# Patient Record
Sex: Male | Born: 1940 | Race: White | Hispanic: No | Marital: Married | Smoking: Former smoker
Health system: Southern US, Community
[De-identification: ages and names within clinical notes are randomized; demographics above are authoritative.]

## PROBLEM LIST (undated history)

## (undated) DIAGNOSIS — R519 Headache, unspecified: Secondary | ICD-10-CM

## (undated) DIAGNOSIS — K219 Gastro-esophageal reflux disease without esophagitis: Secondary | ICD-10-CM

## (undated) DIAGNOSIS — I10 Essential (primary) hypertension: Secondary | ICD-10-CM

## (undated) DIAGNOSIS — R51 Headache: Secondary | ICD-10-CM

## (undated) DIAGNOSIS — I209 Angina pectoris, unspecified: Secondary | ICD-10-CM

## (undated) DIAGNOSIS — M1990 Unspecified osteoarthritis, unspecified site: Secondary | ICD-10-CM

## (undated) DIAGNOSIS — C801 Malignant (primary) neoplasm, unspecified: Secondary | ICD-10-CM

---

## 2012-06-13 HISTORY — PX: OTHER SURGICAL HISTORY: SHX169

## 2014-05-19 ENCOUNTER — Inpatient Hospital Stay (HOSPITAL_COMMUNITY)
Admission: EM | Admit: 2014-05-19 | Discharge: 2014-05-21 | DRG: 247 | Disposition: A | Discharge status: Home / Self Care | Payer: Medicare Other | Source: Other Acute Inpatient Hospital | Attending: Cardiology | Admitting: Cardiology

## 2014-05-19 ENCOUNTER — Inpatient Hospital Stay (HOSPITAL_COMMUNITY): Admission: AD | Admit: 2014-05-19 | Payer: Self-pay | Source: Other Acute Inpatient Hospital | Admitting: Cardiology

## 2014-05-19 ENCOUNTER — Observation Stay: Payer: Self-pay | Admitting: Internal Medicine

## 2014-05-19 ENCOUNTER — Inpatient Hospital Stay: Admission: AD | Admit: 2014-05-19 | Payer: Self-pay | Source: Other Acute Inpatient Hospital | Admitting: Cardiology

## 2014-05-19 ENCOUNTER — Encounter (HOSPITAL_COMMUNITY)
Admission: EM | Disposition: A | Discharge status: Home / Self Care | Payer: Medicare Other | Source: Other Acute Inpatient Hospital | Attending: Cardiology

## 2014-05-19 ENCOUNTER — Encounter (HOSPITAL_COMMUNITY): Payer: Self-pay | Admitting: *Deleted

## 2014-05-19 DIAGNOSIS — I2510 Atherosclerotic heart disease of native coronary artery without angina pectoris: Secondary | ICD-10-CM | POA: Diagnosis not present

## 2014-05-19 DIAGNOSIS — I10 Essential (primary) hypertension: Secondary | ICD-10-CM | POA: Diagnosis present

## 2014-05-19 DIAGNOSIS — E118 Type 2 diabetes mellitus with unspecified complications: Secondary | ICD-10-CM | POA: Diagnosis present

## 2014-05-19 DIAGNOSIS — Z9221 Personal history of antineoplastic chemotherapy: Secondary | ICD-10-CM | POA: Diagnosis not present

## 2014-05-19 DIAGNOSIS — Z85118 Personal history of other malignant neoplasm of bronchus and lung: Secondary | ICD-10-CM

## 2014-05-19 DIAGNOSIS — R079 Chest pain, unspecified: Secondary | ICD-10-CM | POA: Diagnosis present

## 2014-05-19 DIAGNOSIS — I2119 ST elevation (STEMI) myocardial infarction involving other coronary artery of inferior wall: Principal | ICD-10-CM | POA: Diagnosis present

## 2014-05-19 DIAGNOSIS — H409 Unspecified glaucoma: Secondary | ICD-10-CM | POA: Diagnosis present

## 2014-05-19 DIAGNOSIS — Z87891 Personal history of nicotine dependence: Secondary | ICD-10-CM

## 2014-05-19 DIAGNOSIS — K219 Gastro-esophageal reflux disease without esophagitis: Secondary | ICD-10-CM | POA: Diagnosis present

## 2014-05-19 HISTORY — PX: CARDIAC CATHETERIZATION: SHX172

## 2014-05-19 HISTORY — DX: Headache: R51

## 2014-05-19 HISTORY — DX: Malignant (primary) neoplasm, unspecified: C80.1

## 2014-05-19 HISTORY — DX: Gastro-esophageal reflux disease without esophagitis: K21.9

## 2014-05-19 HISTORY — DX: Unspecified osteoarthritis, unspecified site: M19.90

## 2014-05-19 HISTORY — DX: Essential (primary) hypertension: I10

## 2014-05-19 HISTORY — DX: Angina pectoris, unspecified: I20.9

## 2014-05-19 HISTORY — DX: Headache, unspecified: R51.9

## 2014-05-19 LAB — COMPREHENSIVE METABOLIC PANEL
ALT: 11 U/L (ref 0–53)
AST: 30 U/L (ref 0–37)
Albumin: 3.5 g/dL (ref 3.4–5.0)
Albumin: 3.4 g/dL — ABNORMAL LOW (ref 3.5–5.2)
Alkaline Phosphatase: 85 U/L
Alkaline Phosphatase: 71 U/L (ref 39–117)
Anion Gap: 9 (ref 7–16)
Anion gap: 16 — ABNORMAL HIGH (ref 5–15)
BILIRUBIN TOTAL: 0.6 mg/dL (ref 0.2–1.0)
BUN: 26 mg/dL — ABNORMAL HIGH (ref 7–18)
BUN: 16 mg/dL (ref 6–23)
CALCIUM: 8.4 mg/dL — AB (ref 8.5–10.1)
CO2: 21 meq/L (ref 19–32)
CREATININE: 0.87 mg/dL (ref 0.50–1.35)
Calcium: 8.5 mg/dL (ref 8.4–10.5)
Chloride: 106 mmol/L (ref 98–107)
Chloride: 101 mEq/L (ref 96–112)
Co2: 25 mmol/L (ref 21–32)
Creatinine: 1.05 mg/dL (ref 0.60–1.30)
EGFR (African American): >60
EGFR (Non-African Amer.): >60
GFR, EST NON AFRICAN AMERICAN: 85 mL/min — AB (ref >90)
GLUCOSE: 128 mg/dL — AB (ref 65–99)
GLUCOSE: 130 mg/dL — AB (ref 70–99)
Osmolality: 286 (ref 275–301)
Potassium: 4 mmol/L (ref 3.5–5.1)
Potassium: 3.6 mEq/L — ABNORMAL LOW (ref 3.7–5.3)
SGOT(AST): 36 U/L (ref 15–37)
SGPT (ALT): 18 U/L
Sodium: 140 mmol/L (ref 136–145)
Sodium: 138 mEq/L (ref 137–147)
Total Bilirubin: 0.6 mg/dL (ref 0.3–1.2)
Total Protein: 7 g/dL (ref 6.4–8.2)
Total Protein: 6.4 g/dL (ref 6.0–8.3)

## 2014-05-19 LAB — CBC
HCT: 45.9 % (ref 40.0–52.0)
HGB: 15.4 g/dL (ref 13.0–18.0)
MCH: 32.4 pg (ref 26.0–34.0)
MCHC: 33.6 g/dL (ref 32.0–36.0)
MCV: 97 fL (ref 80–100)
Platelet: 173 10*3/uL (ref 150–440)
RBC: 4.75 10*6/uL (ref 4.40–5.90)
RDW: 12.4 % (ref 11.5–14.5)
WBC: 5.8 10*3/uL (ref 3.8–10.6)

## 2014-05-19 LAB — TROPONIN I
TROPONIN I: 2.12 ng/mL — AB (ref <0.30)
TROPONIN-I: 4.9 ng/mL — AB
Troponin-I: 0.45 ng/mL — ABNORMAL HIGH

## 2014-05-19 LAB — CK TOTAL AND CKMB (NOT AT ARMC)
CK, TOTAL: 94 U/L
CK-MB: 3 ng/mL (ref 0.5–3.6)

## 2014-05-19 LAB — APTT: ACTIVATED PTT: 29.3 s (ref 23.6–35.9)

## 2014-05-19 LAB — CK-MB: CK-MB: 7.3 ng/mL — ABNORMAL HIGH (ref 0.5–3.6)

## 2014-05-19 LAB — PROTIME-INR
INR: 0.9
Prothrombin Time: 12.5 secs (ref 11.5–14.7)

## 2014-05-19 LAB — D-DIMER(ARMC): D-Dimer: 706 ng/ml

## 2014-05-19 SURGERY — CORONARY STENT INTERVENTION
Laterality: Right

## 2014-05-19 MED ORDER — NITROGLYCERIN 1 MG/10 ML FOR IR/CATH LAB
INTRA_ARTERIAL | Status: AC
  Filled 2014-05-19: qty 10

## 2014-05-19 MED ORDER — ONDANSETRON HCL 4 MG/2ML IJ SOLN: 4.0000 mg | Freq: Four times a day (QID) | INTRAMUSCULAR | Status: DC | PRN

## 2014-05-19 MED ORDER — NITROGLYCERIN IN D5W 200-5 MCG/ML-% IV SOLN: 5.0000 ug/min | INTRAVENOUS | Status: DC

## 2014-05-19 MED ORDER — ASPIRIN 81 MG PO CHEW
81.0000 mg | CHEWABLE_TABLET | Freq: Every day | ORAL | Status: DC
  Administered 2014-05-20: 81 mg via ORAL
  Filled 2014-05-19 (×2): qty 1

## 2014-05-19 MED ORDER — SODIUM CHLORIDE 0.9 % IV SOLN
INTRAVENOUS | Status: DC
Start: 2014-05-19 — End: 2014-05-20
  Administered 2014-05-19: 04:00:00 via INTRAVENOUS

## 2014-05-19 MED ORDER — HEPARIN (PORCINE) IN NACL 2-0.9 UNIT/ML-% IJ SOLN
INTRAMUSCULAR | Status: AC
  Filled 2014-05-19: qty 1000

## 2014-05-19 MED ORDER — CEFAZOLIN SODIUM-DEXTROSE 2-3 GM-% IV SOLR
INTRAVENOUS | Status: AC
  Filled 2014-05-19: qty 50

## 2014-05-19 MED ORDER — ATORVASTATIN CALCIUM 80 MG PO TABS
80.0000 mg | ORAL_TABLET | Freq: Every day | ORAL | Status: DC
  Administered 2014-05-20: 80 mg via ORAL
  Filled 2014-05-19 (×3): qty 1

## 2014-05-19 MED ORDER — ASPIRIN EC 81 MG PO TBEC: 81.0000 mg | DELAYED_RELEASE_TABLET | Freq: Every day | ORAL | Status: DC

## 2014-05-19 MED ORDER — MIDAZOLAM HCL 2 MG/2ML IJ SOLN
INTRAMUSCULAR | Status: AC
  Filled 2014-05-19: qty 2

## 2014-05-19 MED ORDER — ATROPINE SULFATE 0.1 MG/ML IJ SOLN
INTRAMUSCULAR | Status: AC
  Filled 2014-05-19: qty 10

## 2014-05-19 MED ORDER — FENTANYL CITRATE 0.05 MG/ML IJ SOLN
INTRAMUSCULAR | Status: AC
  Filled 2014-05-19: qty 2

## 2014-05-19 MED ORDER — LIDOCAINE HCL (PF) 1 % IJ SOLN
INTRAMUSCULAR | Status: AC
  Filled 2014-05-19: qty 30

## 2014-05-19 MED ORDER — ASPIRIN 300 MG RE SUPP: 300.0000 mg | RECTAL | Status: DC

## 2014-05-19 MED ORDER — NITROGLYCERIN 0.4 MG SL SUBL: 0.4000 mg | SUBLINGUAL_TABLET | SUBLINGUAL | Status: DC | PRN

## 2014-05-19 MED ORDER — ASPIRIN 81 MG PO CHEW: 324.0000 mg | CHEWABLE_TABLET | ORAL | Status: DC

## 2014-05-19 MED ORDER — TICAGRELOR 90 MG PO TABS: 90.0000 mg | ORAL_TABLET | Freq: Two times a day (BID) | ORAL | Status: DC

## 2014-05-19 MED ORDER — ACETAMINOPHEN 325 MG PO TABS: 650.0000 mg | ORAL_TABLET | ORAL | Status: DC | PRN

## 2014-05-19 MED ORDER — BIVALIRUDIN 250 MG IV SOLR
INTRAVENOUS | Status: AC
  Filled 2014-05-19: qty 250

## 2014-05-19 MED ORDER — METOPROLOL TARTRATE 12.5 MG HALF TABLET
12.5000 mg | ORAL_TABLET | Freq: Two times a day (BID) | ORAL | Status: DC
  Administered 2014-05-20 (×2): 12.5 mg via ORAL
  Filled 2014-05-19 (×5): qty 1

## 2014-05-19 MED ORDER — TICAGRELOR 90 MG PO TABS
90.0000 mg | ORAL_TABLET | Freq: Two times a day (BID) | ORAL | Status: DC
  Administered 2014-05-20 (×2): 90 mg via ORAL
  Filled 2014-05-19 (×5): qty 1

## 2014-05-19 NOTE — CV Procedure (Signed)
PTCA stenting report dictated on 05/19/2014 dictation number is [id number]

## 2014-05-19 NOTE — H&P (Signed)
Brian Berg is an 73 y.o. male.   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 Radiation in the past, GERD, glaucoma, was transferred from [facility] cardiac cath lab for PCI to mid RCA. Patient while in the conference this morning developed retrosternal chest pain and elbow pain associated with nausea is lasted approximately 30-40 minutes her EKG doneat [facility] showed normal sinus rhythm with possible inferior wall myocardial infarction age undetermined with nonspecific T-wave changes in inferior leads and was noted to have elevated troponin I her first set 0.45 second set was 4.90. Patient substernally underwent left cardiac cath at [facility] which showed mild left and was noted to have 80-85% mid RCA stenosis. Patient is transferred to [facility] for emergency PCI to mid RCA.  No past medical history on file.  No past surgical history on file.  No family history on file. Social History:  has no tobacco, alcohol, and drug history on file.  Allergies: Allergies not on file  No prescriptions prior to admission    No results found for this or any previous visit (from the past 48 hour(s)). No results found.  Review of Systems  Constitutional: Negative for fever and chills.  Eyes: Negative for double vision and photophobia.  Respiratory: Negative for cough, hemoptysis, sputum production and shortness of breath.   Cardiovascular: Positive for chest pain. Negative for palpitations, orthopnea and claudication.  Gastrointestinal: Positive for nausea. Negative for vomiting and abdominal pain.  Genitourinary: Negative for dysuria.  Neurological: Negative for dizziness and headaches.    There were no vitals taken for this visit. Physical Exam  Constitutional: He is oriented to person, place, and time.   HENT:  Head: Normocephalic and atraumatic.  Eyes: Conjunctivae are normal. Pupils are equal, round, and reactive to light. Left eye exhibits no discharge. No scleral icterus.  Neck: Normal range of motion. Neck supple. No JVD present. No tracheal deviation present. No thyromegaly present.  Cardiovascular: Normal rate, regular rhythm and intact distal pulses.   Murmur (soft systolic murmur and S4 gallop noted) heard. Respiratory: Effort normal and breath sounds normal. No respiratory distress. He has no wheezes. He has no rales.  GI: Soft. Bowel sounds are normal. He exhibits no distension. There is no tenderness. There is no rebound.  Musculoskeletal: He exhibits no edema or tenderness.  Neurological: He is alert and oriented to person, place, and time.     Assessment/Plan Status post inferior wall myocardial infarction Hypertension History of CVA of lung GERD Elevated glucose rule out diabetes mellitus History of glaucoma History of remote tobacco abuse Plan Discussed with patient briefly regarding PTCA stenting this risk and benefits and consents for PCI  [facility] 05/19/2014, 7:31 PM

## 2014-05-19 NOTE — Interval H&P Note (Signed)
He he takes aspciCath Lab Visit (complete for each Cath Lab visit)  Clinical Evaluation Leading to the Procedure:   ACS: Yes.    Non-ACS:    Anginal Classification: CCS IV  Anti-ischemic medical therapy: Maximal Therapy (2 or more classes of medications)  Non-Invasive Test Results: No non-invasive testing performed  Prior CABG: No previous CABG      I was present and participated in the regional anesthetic procedure key events.  By signing, I attest that I have identified and re-evaluated the patient immediately before the induction of anesthesia and I am satisfied that my anesthetic plan is suitable for the patient's condition and procedure.History and Physical Interval Note:  05/19/2014 7:38 PM  Brian Berg  has presented today for surgery, with the diagnosis of NSTEMI  The various methods of treatment have been discussed with the patient and family. After consideration of risks, benefits and other options for treatment, the patient has consented to  Procedure(s): LEFT HEART CATHETERIZATION WITH CORONARY ANGIOGRAM (N/A) as a surgical intervention .  The patient's history has been reviewed, patient examined, no change in status, stable for surgery.  I have reviewed the patient's chart and labs.  Questions were answered to the patient's satisfaction.     Clent Demark

## 2014-05-20 LAB — LIPID PANEL
CHOL/HDL RATIO: 3.5 ratio
Cholesterol: 176 mg/dL (ref 0–200)
HDL: 50 mg/dL (ref >39)
LDL Cholesterol: 102 mg/dL — ABNORMAL HIGH (ref 0–99)
Triglycerides: 120 mg/dL (ref <150)
VLDL: 24 mg/dL (ref 0–40)

## 2014-05-20 LAB — CBC
HCT: 39.7 % (ref 39.0–52.0)
HEMOGLOBIN: 13.9 g/dL (ref 13.0–17.0)
MCH: 32.5 pg (ref 26.0–34.0)
MCHC: 35 g/dL (ref 30.0–36.0)
MCV: 92.8 fL (ref 78.0–100.0)
PLATELETS: 158 10*3/uL (ref 150–400)
RBC: 4.28 MIL/uL (ref 4.22–5.81)
RDW: 12.3 % (ref 11.5–15.5)
WBC: 6.6 10*3/uL (ref 4.0–10.5)

## 2014-05-20 LAB — TROPONIN I
Troponin I: 0.82 ng/mL (ref <0.30)
Troponin I: 1.28 ng/mL (ref <0.30)

## 2014-05-20 LAB — BASIC METABOLIC PANEL
Anion gap: 14 (ref 5–15)
BUN: 14 mg/dL (ref 6–23)
CO2: 20 mEq/L (ref 19–32)
Calcium: 8.5 mg/dL (ref 8.4–10.5)
Chloride: 102 mEq/L (ref 96–112)
Creatinine, Ser: 0.88 mg/dL (ref 0.50–1.35)
GFR calc Af Amer: >90 mL/min (ref >90)
GFR, EST NON AFRICAN AMERICAN: 84 mL/min — AB (ref >90)
Glucose, Bld: 104 mg/dL — ABNORMAL HIGH (ref 70–99)
POTASSIUM: 3.8 meq/L (ref 3.7–5.3)
Sodium: 136 mEq/L — ABNORMAL LOW (ref 137–147)

## 2014-05-20 LAB — MRSA PCR SCREENING: MRSA BY PCR: NEGATIVE

## 2014-05-20 MED ORDER — TIMOLOL MALEATE 0.5 % OP SOLN
1.0000 [drp] | Freq: Every day | OPHTHALMIC | Status: DC
  Administered 2014-05-20: 1 [drp] via OPHTHALMIC
  Filled 2014-05-20: qty 5

## 2014-05-20 MED ORDER — PANTOPRAZOLE SODIUM 40 MG PO TBEC
40.0000 mg | DELAYED_RELEASE_TABLET | Freq: Every day | ORAL | Status: DC
Start: 2014-05-20 — End: 2014-05-21
  Administered 2014-05-20: 40 mg via ORAL
  Filled 2014-05-20: qty 1

## 2014-05-20 MED ORDER — DOXAZOSIN MESYLATE 4 MG PO TABS
4.0000 mg | ORAL_TABLET | Freq: Every day | ORAL | Status: DC
  Administered 2014-05-20: 4 mg via ORAL
  Filled 2014-05-20 (×2): qty 1

## 2014-05-20 NOTE — Cardiovascular Report (Signed)
NAME:  Brian Berg, Brian Berg                ACCOUNT NO.:  [id number]  MEDICAL RECORD NO.:  [id number]  LOCATION:  [facility]                         FACILITY:  [facility]  PHYSICIAN:  Shavonda Wiedman N. Terrence Dupont, M.D. DATE OF BIRTH:  07-09-1941  DATE OF PROCEDURE:  05/19/2014 DATE OF DISCHARGE:                           CARDIAC CATHETERIZATION   PROCEDURES PERFORMED: 1. Successful percutaneous transluminal coronary angioplasty to mid     right coronary artery using 2.5 x 12 mm long Emerge balloon. 2. Successful deployment of 3.0 x 23 mm long Xience Alpine drug-     eluting stent in mid right coronary artery. 3. Successful postdilatation of this stent using 3.25 x 15 mm long [place]     Emerge balloon.  INDICATION FOR THE PROCEDURE:  Mr. Kandler is a 73 year old male with past medical history significant for hypertension, history of CA of the lung status post chemotherapy and radiation in the past, GERD, glaucoma, remote tobacco abuse who was transferred from [facility] Cath Lab for PCI to mid RCA.  The patient while in the conference this morning developed retrosternal chest pain, and elbow pain associated with nausea, which lasted approximately 30-40 minutes.  EKG done at [facility] showed normal sinus rhythm with possible inferior wall myocardial infarction with Q-waves in lead 3, aVF, and nonspecific T-wave changes in the inferior leads, and was noted to have elevated troponin I of 0.25, second set was 4.90.  Subsequently, the patient underwent left cardiac cath at [facility], which showed mild LAD and circumflex disease, and was noted to have 80-85% mid RCA stenosis with ruptured plaque with TIMI-3 distal flow.  The patient was transferred to [facility] for emergency PCI to RCA.  After obtaining the informed consent, the patient was brought to the cath lab and was placed on fluoroscopy table.  Right groin was prepped and draped in usual fashion.  A 1%  Xylocaine was used for local anesthesia in the right groin.  The patient already had 5-French arterial sheath, which was exchanged over the wire to 6-French sheath without difficulty. Next, 6-French right Judkins guiding catheter was advanced over the wire under fluoroscopic guidance up to the ascending aorta.  Wire was pulled out.  The catheter was aspirated and connected to the Manifold. Catheter was further advanced and engaged into right coronary ostium. Multiple views of the right system were obtained.  FINDINGS:  RCA has 20-25% proximal stenosis and then 80-90% mid stenosis with ruptured plaque with TIMI-3 distal flow.  PLV branch has 40% proximal stenosis with haziness, PDA was patent.  INTERVENTIONAL PROCEDURE:  Successful PTCA to mid RCA was done using 2.5 x 12 mm long Emerge balloon for predilatation, and then 3.0 x 23 mm long Xience Alpine drug-eluting stent was deployed in mid RCA at 11 atmospheric pressure.  The stent was post dilated using 3.25 x 15 mm long [place] Emerge balloon going up to 18 atmospheric pressure.  Lesion dilated from 80-90% to 0% residual with excellent TIMI grade 3 distal flow without evidence of dissection or distal embolization.  The patient received weight based Angiomax during the procedure. The patient already received aspirin and Brilinta at [facility]  prior to transfer to [facility].  The patient tolerated the procedure well.  There were no complications.  The patient was transferred to CCU in stable condition.     Allegra Lai. Terrence Dupont, M.D.     MNH/MEDQ  D:  05/19/2014  T:  05/20/2014  Job:  [id number]

## 2014-05-20 NOTE — Progress Notes (Signed)
CARDIAC REHAB PHASE I   PRE:  Rate/Rhythm: 77 sinus  BP:  Supine:   Sitting: 135/101  Standing:    SaO2: 95 RA  MODE:  Ambulation: 700 ft   POST:  Rate/Rhythem: 91 sinus  BP:  Supine:   Sitting: 141/82  Standing:    SaO2: 95 RA  Pt ambulated 700 ft with assist x1.  Pt tolerated walk well without complaint.  Pt returned to bed after walk and voiding for education.  Discussed MI book, stent book and card, Brillinta, risk factors, restrictions, when to call 911/NTG use, diet, and exercise.  Also permission given to send info to [facility] for Cardiac Rehab Phase II.  Pt encouraged to establish with a cardiologist closer to home for f/u.  Pt and wife voiced understanding. Alberteen Sam, MA, ACSM RCEP [phone]  Clotilde Dieter

## 2014-05-20 NOTE — Progress Notes (Signed)
UR completed 

## 2014-05-20 NOTE — Progress Notes (Signed)
CRITICAL VALUE ALERT  Critical value received: Trop 2.12  Date of notification:  05/19/2014  Time of notification:  2150  Critical value read back:Yes.    Nurse who received alert:  Tyrell Antonio  MD notified (1st page):  Marshall Cork, MD  Time of first page:  2155  MD notified (2nd page):  Time of second page:  Responding MD:  Bailey Mech  Time MD responded:  2200

## 2014-05-20 NOTE — Progress Notes (Signed)
Patient arrived via stretcher from cath lab. 104F sheath in Right groin, level zero. VSS see flowsheet. Patient alert and cooperative. Very pleasant family at bedside with patient. 0.9 NS infusing via pump into PIV in right FA. Dr. Marshall Cork in room to check on patient. Patient voiding using urinal. Will continue to monitor closely.

## 2014-05-20 NOTE — Progress Notes (Signed)
Ref: No PCP Per Patient   Subjective:  No chest pain. Cardiac enzymes almost trending down. Afebrile.  Objective:  Vital Signs in the last 24 hours: Temp:  [97.4 F (36.3 C)-98.1 F (36.7 C)] 98.1 F (36.7 C) (11/07 0755) Pulse Rate:  [66-76] 67 (11/07 0755) Cardiac Rhythm:  [-] Normal sinus rhythm;Heart block (11/07 0030) Resp:  [9-20] 16 (11/07 0755) BP: (99-167)/(68-106) 136/74 mmHg (11/07 0755) SpO2:  [92 %-100 %] 95 % (11/07 0755) Arterial Line BP: (139-166)/(72-87) 139/72 mmHg (11/06 2330) Weight:  [75.4 kg (166 lb 3.6 oz)] 75.4 kg (166 lb 3.6 oz) (11/06 2131)  Physical Exam: BP Readings from Last 1 Encounters:  05/20/14 136/74    Wt Readings from Last 1 Encounters:  05/19/14 75.4 kg (166 lb 3.6 oz)    Weight change:   HEENT: [place]/AT, Eyes- PERL, EOMI, Conjunctiva-Pink, Sclera-Non-icteric Neck: No JVD, No bruit, Trachea midline. Lungs:  Clear, Bilateral. Cardiac:  Regular rhythm, normal S1 and S2, no S3.  Abdomen:  Soft, non-tender. Extremities:  No edema present. No cyanosis. No clubbing. Very small bruise right groin. CNS: AxOx3, Cranial nerves grossly intact, moves all 4 extremities. Right handed. Skin: Warm and dry.   Intake/Output from previous day: 11/06 0701 - 11/07 0700 In: -  Out: 825 [Urine:825]    Lab Results: BMET    Component Value Date/Time   NA 136* 05/20/2014 0330   NA 138 05/19/2014 2150   K 3.8 05/20/2014 0330   K 3.6* 05/19/2014 2150   CL 102 05/20/2014 0330   CL 101 05/19/2014 2150   CO2 20 05/20/2014 0330   CO2 21 05/19/2014 2150   GLUCOSE 104* 05/20/2014 0330   GLUCOSE 130* 05/19/2014 2150   BUN 14 05/20/2014 0330   BUN 16 05/19/2014 2150   CREATININE 0.88 05/20/2014 0330   CREATININE 0.87 05/19/2014 2150   CALCIUM 8.5 05/20/2014 0330   CALCIUM 8.5 05/19/2014 2150   GFRNONAA 84* 05/20/2014 0330   GFRNONAA 85* 05/19/2014 2150   GFRAA >90 05/20/2014 0330   GFRAA >90 05/19/2014 2150   CBC    Component Value Date/Time   WBC  6.6 05/20/2014 0330   RBC 4.28 05/20/2014 0330   HGB 13.9 05/20/2014 0330   HCT 39.7 05/20/2014 0330   PLT 158 05/20/2014 0330   MCV 92.8 05/20/2014 0330   MCH 32.5 05/20/2014 0330   MCHC 35.0 05/20/2014 0330   RDW 12.3 05/20/2014 0330   HEPATIC Function Panel  Recent Labs  05/19/14 2150  PROT 6.4   HEMOGLOBIN A1C No components found for: HGA1C,  MPG CARDIAC ENZYMES Lab Results  Component Value Date   TROPONINI 1.28* 05/20/2014   TROPONINI 0.82* 05/20/2014   TROPONINI 2.12* 05/19/2014   BNP No results for input(s): PROBNP in the last 8760 hours. TSH No results for input(s): TSH in the last 8760 hours. CHOLESTEROL  Recent Labs  05/20/14 0330  CHOL 176    Scheduled Meds: . aspirin  81 mg Oral Daily  . atorvastatin  80 mg Oral q1800  . doxazosin  4 mg Oral QHS  . metoprolol tartrate  12.5 mg Oral BID  . pantoprazole  40 mg Oral Q1200  . ticagrelor  90 mg Oral BID  . timolol  1 drop Both Eyes Daily   Continuous Infusions: . sodium chloride 150 mL/hr at 05/19/14 0359  . nitroGLYCERIN     PRN Meds:.acetaminophen, nitroGLYCERIN, ondansetron (ZOFRAN) IV  Assessment/Plan: Acute inferior wall myocardial infarction S/P Stent placement in RCA Hypertension History  of CA of lung GERD Elevated glucose rule out diabetes mellitus History of glaucoma History of remote tobacco abuse  Resume home medications. Increase activity. Transfer to telemetry bed.   LOS: 1 day    Dixie Dials  MD  05/20/2014, 11:10 AM

## 2014-05-20 NOTE — Progress Notes (Signed)
Sheath pulled from Right groin, manual pressure held for 40 minutes. VSS during procedure. Patient given directions for lying flat and still for 4 hours post procedure. Patient stated he understood. Wife at bedside and she stated she would watch him. Right groin level zero. Patient denies chest pain or pressure. Monitoring closely.

## 2014-05-20 NOTE — Progress Notes (Signed)
Critical lab value called at 2150 for Trop 2.12. Dr. Marshall Cork notified that the first Trop after procedure will be elevated while he was in patients room. Dr. Marshall Cork stated he was aware of that but wanted one drawn now and then again in 6 hours. 1st one drawn right after patient arrived in room from cath lab. Patient had DIS placed in RCA this evening by Dr. Marshall Cork.  Patient denies any chest pain or pressure. Will continue to monitor closely.

## 2014-05-21 MED ORDER — TICAGRELOR 90 MG PO TABS: 90.0000 mg | ORAL_TABLET | Freq: Two times a day (BID) | ORAL | Status: AC

## 2014-05-21 MED ORDER — METOPROLOL TARTRATE 25 MG PO TABS: 25.0000 mg | ORAL_TABLET | Freq: Two times a day (BID) | ORAL | Status: AC

## 2014-05-21 MED ORDER — NITROGLYCERIN 0.4 MG SL SUBL: 0.4000 mg | SUBLINGUAL_TABLET | SUBLINGUAL | Status: AC | PRN

## 2014-05-21 MED ORDER — ATORVASTATIN CALCIUM 80 MG PO TABS: 80.0000 mg | ORAL_TABLET | Freq: Every day | ORAL | Status: AC

## 2014-05-21 MED ORDER — ASPIRIN 81 MG PO CHEW: 81.0000 mg | CHEWABLE_TABLET | Freq: Every day | ORAL | Status: AC

## 2014-05-21 NOTE — Plan of Care (Signed)
Problem: Phase I Progression Outcomes Goal: Distal pulses equal to baseline Outcome: Progressing

## 2014-05-21 NOTE — Plan of Care (Signed)
Problem: Phase I Progression Outcomes Goal: Pain controlled with appropriate interventions Outcome: Completed/Met Date Met:  05/21/14     

## 2014-05-21 NOTE — Discharge Summary (Signed)
Physician Discharge Summary  Patient ID: Brian Berg MRN: [id number] DOB/AGE: 72-May-1942 72 y.o.  Admit date: 05/19/2014 Discharge date: 05/21/2014  Admission Diagnoses: Acute inferior wall myocardial infarction Hypertension History of CA of lung GERD Elevated glucose rule out diabetes mellitus History of glaucoma History of remote tobacco abuse  Discharge Diagnoses:  Principle Problem: *  Acute NSTEMI * S/P Stent placement in RCA Hypertension History of CA of lung GERD Diabetes Mellitus, II, diet controlled History of glaucoma History of remote tobacco abuse  Discharged Condition: good  Hospital Course: 73 year old male with past medical history significant for hypertension, CA of right lung status post chemotherapy and radiation in the past, GERD, glaucoma, was transferred from [facility] hospital's cardiac cath lab for PCI to mid RCA. Patient while in the conference this morning developed retrosternal chest pain and elbow pain associated with nausea that lasted approximately 30-40 minutes. His EKG done at [facility] showed normal sinus rhythm with possible inferior wall myocardial infarction age undetermined with nonspecific T-wave changes in inferior leads and was noted to have elevated troponin I. Patient substernally underwent left cardiac cath at [facility] which showed mild left and was noted to have 80-85% mid RCA stenosis. Patient is transferred to [facility] for emergency PCI to mid RCA. He had 3.0x 23 mm Xience Alpine drug eluting stent deployed successfully by Dr. Terrence Dupont. His elevated blood sugars are controlled with diet for now. Discussed diet, activity and holding celecoxib for 1 month. Patient prefers cardiac rehab in [place].   Consults: cardiology  Significant Diagnostic Studies: labs: Normal CBC, BMET with mildly elevated blood sugars. Elevated troponin-I -0.82 to 4.90. Normal total cholesterol with LDL cholesterol of 102  mg. and HDL cholesterol level of 50 mg.  EKG-SR with 1st degree AV block.  Cardiac cath-Successful placement of 3.0 x 23 mm Xience Alpine DES in RCA.  Treatments: cardiac meds: Aspirin, Atorvastatin, Ticagrelor, metoprolol and home medications except celecoxib.  Discharge Exam: Blood pressure 112/81, pulse 80, temperature 98.7 F (37.1 C), temperature source Oral, resp. rate 19, height 5\' 7"  (1.702 m), weight 75.4 kg (166 lb 3.6 oz), SpO2 94 %. HEENT: [place]/AT, Eyes- PERL, EOMI, Conjunctiva-Pink, Sclera-Non-icteric Neck: No JVD, No bruit, Trachea midline. Lungs: Clear, Bilateral. Cardiac: Regular rhythm, normal S1 and S2, no S3.  Abdomen: Soft, non-tender. Extremities: No edema present. No cyanosis. No clubbing. Very small bruise right groin. CNS: AxOx3, Cranial nerves grossly intact, moves all 4 extremities. Right handed. Skin: Warm and dry.  Disposition: 01, Self care or Home.     Medication List    STOP taking these medications        celecoxib 200 MG capsule  Commonly known as:  CELEBREX     FLUZONE HIGH-DOSE 0.5 ML Susy  Generic drug:  Influenza Vac Split High-Dose     PREVNAR 13 Susp injection  Generic drug:  pneumococcal 13-valent conjugate vaccine      TAKE these medications        aspirin 81 MG chewable tablet  Chew 1 tablet (81 mg total) by mouth daily.     atorvastatin 80 MG tablet  Commonly known as:  LIPITOR  Take 1 tablet (80 mg total) by mouth daily at 6 PM.     BIOFLEX Tabs  Take 1 tablet by mouth 2 (two) times daily.     diphenhydramine-acetaminophen 25-500 MG Tabs  Commonly known as:  TYLENOL PM  Take 2 tablets by mouth at bedtime.     doxazosin 4 MG  tablet  Commonly known as:  CARDURA  Take 4 mg by mouth at bedtime.     metoprolol tartrate 25 MG tablet  Commonly known as:  LOPRESSOR  Take 1 tablet (25 mg total) by mouth 2 (two) times daily.     multivitamin with minerals Tabs tablet  Take 1 tablet by mouth daily.     nitroGLYCERIN  0.4 MG SL tablet  Commonly known as:  NITROSTAT  Place 1 tablet (0.4 mg total) under the tongue every 5 (five) minutes x 3 doses as needed for chest pain.     omeprazole 40 MG capsule  Commonly known as:  PRILOSEC  Take 40 mg by mouth at bedtime.     SUMAtriptan 100 MG tablet  Commonly known as:  IMITREX  Take 100 mg by mouth daily as needed for migraine.     ticagrelor 90 MG Tabs tablet  Commonly known as:  BRILINTA  Take 1 tablet (90 mg total) by mouth 2 (two) times daily.     timolol 0.5 % ophthalmic solution  Commonly known as:  TIMOPTIC  Place 1 drop into both eyes daily.           Follow-up Information    Follow up with [facility] A, MD. Schedule an appointment as soon as possible for a visit in 1 week.   Specialty:  Internal Medicine   Contact information:   [address] [place] [address] [phone]       Signed: Birdie Riddle 05/21/2014, 10:55 AM

## 2014-05-22 LAB — POCT ACTIVATED CLOTTING TIME: Activated Clotting Time: 382 seconds

## 2014-05-22 LAB — HEMOGLOBIN A1C
Hgb A1c MFr Bld: 5.7 % — ABNORMAL HIGH (ref <5.7)
Mean Plasma Glucose: 117 mg/dL — ABNORMAL HIGH (ref <117)

## 2014-05-22 MED FILL — Sodium Chloride IV Soln 0.9%: INTRAVENOUS | Qty: 50 | Status: AC

## 2014-06-22 ENCOUNTER — Encounter (HOSPITAL_COMMUNITY): Payer: Self-pay | Admitting: Cardiology

## 2014-11-04 NOTE — Discharge Summary (Signed)
PATIENT NAME:  Brian Berg, Brian Berg MR#:  [id number] DATE OF BIRTH:  [birth year]  DATE OF ADMISSION:  05/19/2014 DATE OF DISCHARGE:    PRIMARY CARE PHYSICIAN:  Delle Reining, MD, in [place], [place].   FINAL DIAGNOSES: 1.  Acute myocardial infarction.  2.  Gastroesophageal reflux disease.  3.  Hypertension.  4.  Glaucoma.  5.  Lung cancer history, also spots on the liver.  6.  Thyroid mass on CAT scan.   CURRENT MEDICATIONS: Include normal saline at 100 mL an hour, heparin drip, Tylenol p.r.n., Lipitor 40 mg at bedtime, Cardura 4 mg at bedtime, fentanyl injection 25 mg every 1 to 2 minutes after cardiac catheter, NovoLog sliding scale, metoprolol 12.5 mg b.i.d., multivitamin 1 tablet daily, Protonix 40 mg daily, aspirin 81 mg daily.   HOSPITAL COURSE: The patient was admitted and transferred to [facility] on 05/19/2014. The patient had a cardiac catheter done on 11/06, that showed mid RCA 90% stenosis; troponin went up to 4.9. CT scan of the chest showed a left right-sided fibroid mass. Liver hypodensity nonspecific, abnormal heterogeneous density right apex extending from the right hilum with volume loss may be related to prior granulomatous disease, however, lung malignancy is not excluded; in talking with the patient, that is where his lung cancer was; his first troponin was 0.45; white blood cell count 5.8, H and H 15.4 and 45.9, platelet count 173,000, glucose 128, BUN 26, creatinine 1.05, sodium 140, potassium 4.0, chloride 106, CO2 of 25, calcium 8.4; liver function tests normal range; d-dimer was elevated at 706.   The patient will be transferred to [facility], Dr. Humphrey Rolls spoke with Dr. Terrence Dupont there. The patient will go for intervention on the lesion. Medical management as per ordered. For the thyroid mass on CAT scan, I was going to order an ultrasound in the morning and follow up with a TSH and FT4; for his lung cancer history I asked him to follow up with CAT scan report here and bring  it to his oncologist for comparison. For his gastroesophageal reflux I have him on Protonix; for his hypertension, I added metoprolol; for his acute MI he is on heparin drip, aspirin, statin and metoprolol.   TIME SPENT ON DISCHARGE: Was 35 minutes.    ____________________________ Tana Conch. Leslye Peer, MD rjw:nt D: 05/19/2014 17:47:40 ET T: 05/19/2014 17:53:46 ET JOB#: [id number]  cc: Tana Conch. Leslye Peer, MD, <Dictator> Marisue Brooklyn MD ELECTRONICALLY SIGNED 05/25/2014 1:35

## 2014-11-04 NOTE — H&P (Signed)
PATIENT NAME:  Brian Berg, Brian Berg MR#:  [id number] DATE OF BIRTH:  Jan 06, 1941  DATE OF ADMISSION:  05/19/2014  PRIMARY CARE PHYSICIAN: Dr. Bo Merino [place], [place])  CHIEF COMPLAINT: Chest pain.   HISTORY OF PRESENT ILLNESS: A 74 year old man with history of lung cancer presents to the ER with chest pain in the center of his chest, sharp in nature, pain down in both of his elbows, pain rated at a scale of 8/10 in intensity. He does have arthritis so he ended up wanting to sit down for a little while. It got a little bit better. He did have pains over the past month that happened but got better after belching. He had some nausea but no shortness of breath. In the ER, he was found to have a borderline troponin at 0.45, a CAT scan of the chest that was negative for PE. Hospitalist services were contacted for further evaluation.   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   PAST SURGICAL HISTORY: Lung surgery, right shoulder surgery, hernia surgery, and cataract surgery.   ALLERGIES: MORPHINE, OXYCONTIN.  MEDICATIONS: Include aspirin 81 mg daily, Celebrex 200 mg twice a day, doxazosin 4 mg daily, multivitamin daily, omeprazole 40 mg daily, sumatriptan as needed for headache. He also states that he takes an eyedrop.   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 Quit drinking many years ago. No drug use. Works as a Mining engineer.   FAMILY HISTORY: Father died at 57 of multiple myeloma. Mother died of colon cancer.   REVIEW OF SYSTEMS:   CONSTITUTIONAL: Positive for fatigue. No fever, chills, or sweats.  EYES: Does wear glasses.  EARS, NOSE, MOUTH AND THROAT: Decreased hearing. No sore throat. No difficulty swallowing.  CARDIOVASCULAR: Positive for chest pain. No palpitations.   RESPIRATORY: No shortness of breath. No cough. No sputum. No hemoptysis.  GASTROINTESTINAL: No nausea. No vomiting. No abdominal pain. No diarrhea. No constipation. No bright red blood per rectum. No melena.  GENITOURINARY: No burning on urination or hematuria.  MUSCULOSKELETAL: Positive for joint pain.  INTEGUMENT: No rashes or eruptions.  NEUROLOGIC: No fainting or blackouts.  PSYCHIATRIC: No anxiety or depression.  ENDOCRINE: No thyroid problems.  HEMATOLOGIC AND LYMPHATIC: No anemia.   PHYSICAL EXAMINATION: VITAL SIGNS: Temperature 98.3, pulse 84, respirations 18, blood pressure 151/107, pulse ox 96% on room air.  GENERAL: No respiratory distress.  EYES: Conjunctivae and lids normal. Right pupil irregular secondary to cataract surgery. Left pupil pinpoint. Extraocular muscles intact.  EARS, NOSE, MOUTH AND THROAT: Tympanic membranes no erythema. Nasal mucosa no erythema. Throat no erythema, no exudate seen. Lips and gums no lesions.  NECK: No JVD. No bruits. No lymphadenopathy. No thyromegaly. No thyroid nodules palpated.  LUNGS: Clear to auscultation. No use of accessory muscles to breathe. No rhonchi, rales, or wheeze heard.  HEART: S1, S2 normal. No gallops, rubs, or murmurs heard. Carotid upstroke 2+ bilaterally. No bruits. Dorsalis pedis pulses 2+ bilaterally. No edema of the lower extremity.  ABDOMEN: Soft, nontender. No organosplenomegaly. Normoactive bowel sounds. No masses felt.  LYMPHATIC: No lymph nodes in the neck.  MUSCULOSKELETAL: No clubbing, edema, or cyanosis.  SKIN: No rashes or ulcers seen.  NEUROLOGIC: Cranial nerves II through XII grossly intact. Deep tendon reflexes 2+ bilateral lower extremities.  PSYCHIATRIC: The patient is oriented to person, place, and time.  DIAGNOSTIC DATA: D-dimer 706. Glucose 128, BUN 26, creatinine 1.05, sodium 140, potassium 4, chloride 106, CO2 25, calcium 8.4. Liver function tests normal range. White blood cell count 5.8, H and H 15.4  and 45.7, platelet count 173,000. Troponin borderline at 0.45.   Chest x-ray shows distorted partially opacified right lung status post right partial pneumonectomy and radiation.   CT angiogram of chest for PE: Abnormal heterogeneous density right apex extending into the right hilum associated with volume loss. Findings may be related to granulomatous disease, however, lung malignancy is not excluded. Mediastinal adenopathy. Left-sided thyroid mass. Ultrasound recommended. Liver hypodensity. Nonspecific metastatic disease not excluded. MRI recommended.   EKG: Normal sinus rhythm, 82 beats per minute, and looks like Q waves inferiorly.   ASSESSMENT AND PLAN: 1.  Chest pain with elevated troponin concerning for myocardial infarction. I spoke with Dr. Humphrey Rolls, cardiology, who will come and evaluate for potential cardiac catheterization. I added on a troponin for the 1:00 p.m. lab because they just drew his CPK for some reason. We will give aspirin stat, metoprolol stat. We will load with IV heparin bolus. Dr. Humphrey Rolls will evaluate for potential cardiac catheterization. I will order a lipid profile for tomorrow morning and start high-dose Lipitor.  2.  Gastroesophageal reflux disease, on omeprazole as outpatient. Continue Protonix here.  3.  Hypertension. Blood pressure slightly elevated. Add metoprolol.  4.  Glaucoma. Try to figure out which eye drop he is on and then order it.  5.  Lung cancer history, also history of spots on the liver, as per family. I recommended following up with oncologist and bringing their CAT scan here for comparison to previous CAT scans to make sure he does not have metastatic disease.  6.  Thyroid mass on CAT scan. Will get an ultrasound tomorrow morning for further evaluation and check a TSH and free T4.  CODE STATUS: The patient is a FULL code.   TIME SPENT ON ADMISSION: 55 minutes.  ____________________________ Tana Conch. Leslye Peer, MD rjw:sb D: 05/19/2014 14:29:17  ET T: 05/19/2014 14:45:32 ET JOB#: [id number]  cc: Tana Conch. Leslye Peer, MD, <Dictator> Bo Merino, MD [place], [place]) Marisue Brooklyn MD ELECTRONICALLY SIGNED 05/25/2014 1:35

## 2014-11-04 NOTE — Consult Note (Signed)
PATIENT NAME:  Brian Berg, LAMPHIER MR#:  [id number] DATE OF BIRTH:  [birth year]  DATE OF CONSULTATION:  05/19/2014  REFERRING PHYSICIAN:   CONSULTING PHYSICIAN:  Dionisio David, MD  INDICATION FOR CONSULTATION: Chest pain and elevated troponin.   HISTORY OF PRESENT ILLNESS: This is a 74 year old white male with a past medical history of lung cancer status post resection and chemotherapy, apparently been cured, presented to the Emergency Room with chest pain. Chest pain was described as left precordial, pressure-type and sharp chest pain.  He is no longer having any chest pain at this time.   PAST MEDICAL HISTORY: History of hypertension, hyperlipidemia, no history of diabetes, history of lung cancer that was resected and had chemotherapy, apparently is working and doing well.   PHYSICAL EXAMINATION:  GENERAL: He is alert, oriented x3, in no acute distress.  VITAL SIGNS: Stable.  HEENT:  No JVD.  LUNGS: Clear.  HEART: Regular rate and rhythm. Normal S1, S2. No audible murmur.  ABDOMEN: Soft, nontender, positive bowel sounds.  EXTREMITIES: No pedal edema.  NEUROLOGIC: The patient appears to be intact.   LABORATORY DATA:  EKG shows normal sinus rhythm, 82 beats per minute, questionable old inferior wall MI. His troponin is 0.45, CPK 94, MB fraction 3. BUN is 26, creatinine 1.05, glucose 128. His CBC, white count is 5.8, H and H is 15.4/45.9.   ASSESSMENT AND PLAN: The patient has probably non-ST elevation myocardial infarction going on with elevated troponin and chest pain. Advise cardiac catheterization, give heparin in the meantime and aspirin. Also advised getting an echocardiogram.   Thank you very much for the referral. We will schedule the patient for cardiac catheterization today.    ____________________________ Dionisio David, MD sak:bu D: 05/19/2014 14:47:59 ET T: 05/19/2014 15:11:31 ET JOB#: [id number]  cc: Dionisio David, MD, <Dictator> Dionisio David MD ELECTRONICALLY SIGNED  06/15/2014 15:36

## 2015-07-25 IMAGING — CT CT ANGIO CHEST
2 of 6 series · 18 of 36 positions shown · IV contrast (APPLIED)
Comparison: None.

CLINICAL DATA: Chest pain wall eating breakfast. Bilateral arm
numbness and achiness. History of lung cancer. Initial encounter.

EXAM:
CT ANGIOGRAPHY CHEST WITH CONTRAST
TECHNIQUE: Multidetector CT imaging of the chest was performed using the
standard protocol during bolus administration of intravenous
contrast. Multiplanar CT image reconstructions and MIPs were
obtained to evaluate the vascular anatomy.
CONTRAST:  100 cc Isovue 370

[Series 5: pe 1.0 thins · axial · 0.70mm/px · z∈[-830,-591]mm · 17 of 269 slices shown]
[im 15/269  lung]
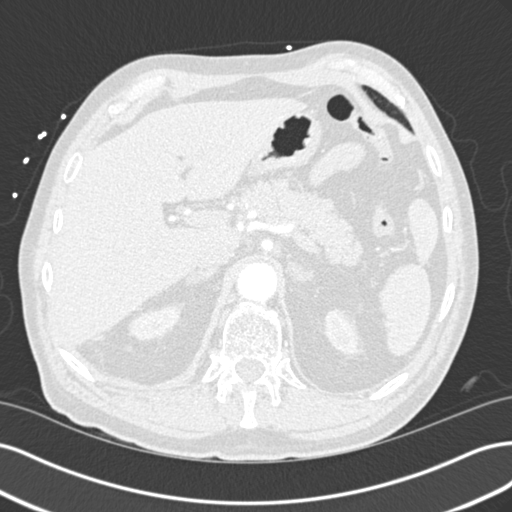
[im 30/269  mediastinal]
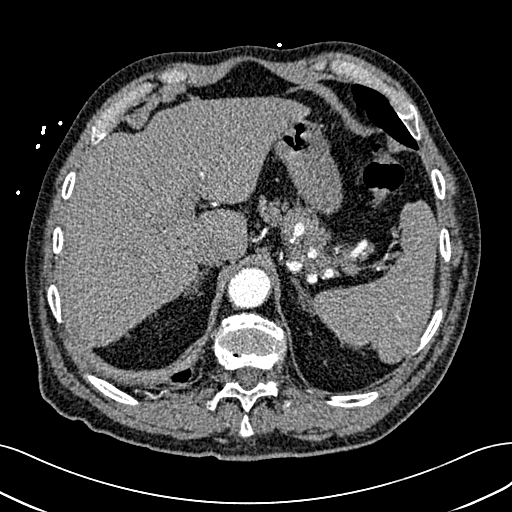
[im 45/269  lung]
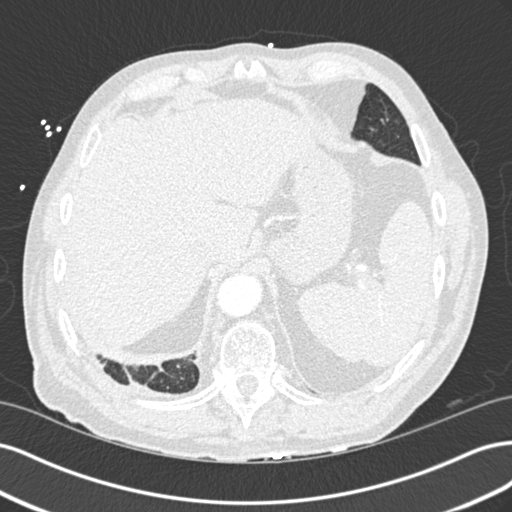
[im 60/269  mediastinal]
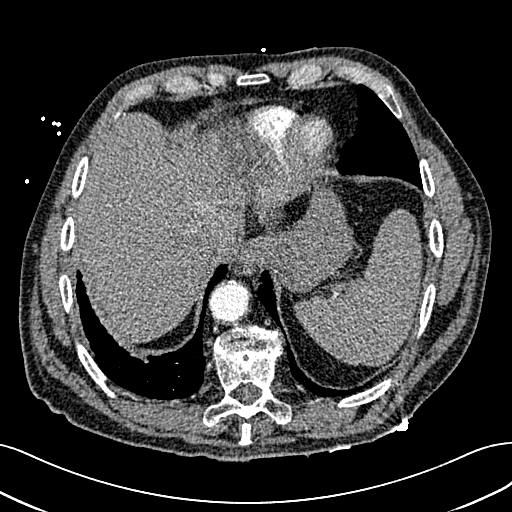
[im 75/269  lung]
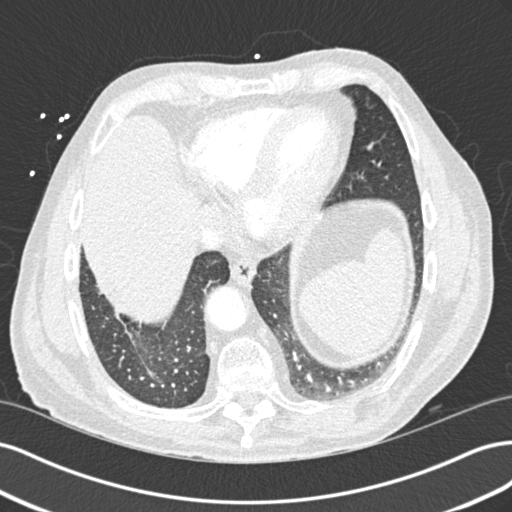
[im 90/269  mediastinal]
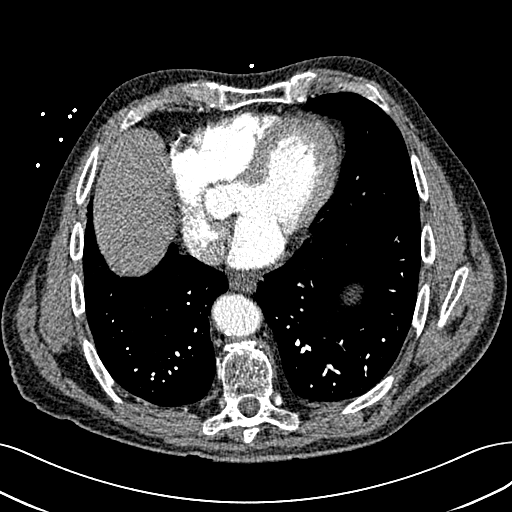
[im 105/269  lung]
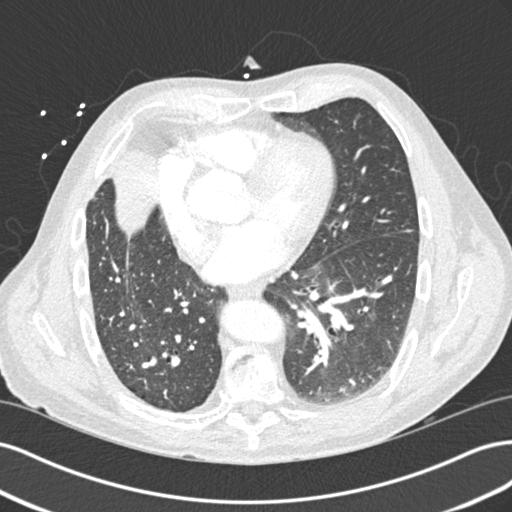
[im 120/269  mediastinal]
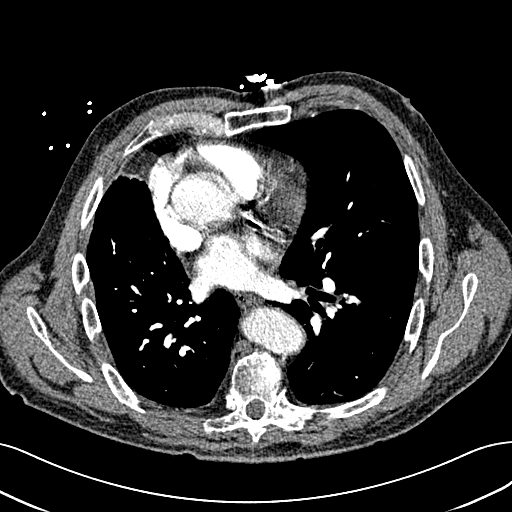
[im 135/269  lung]
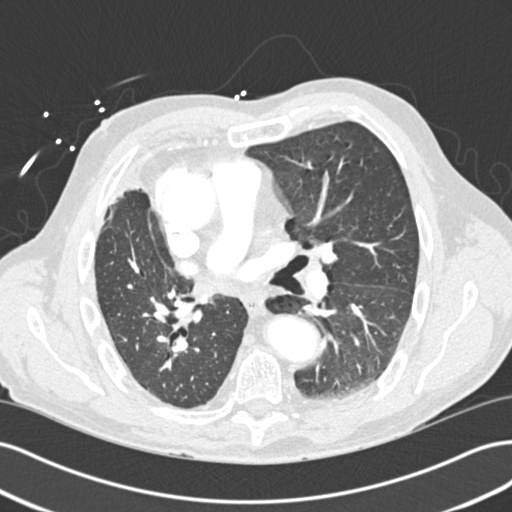
[im 149/269  mediastinal]
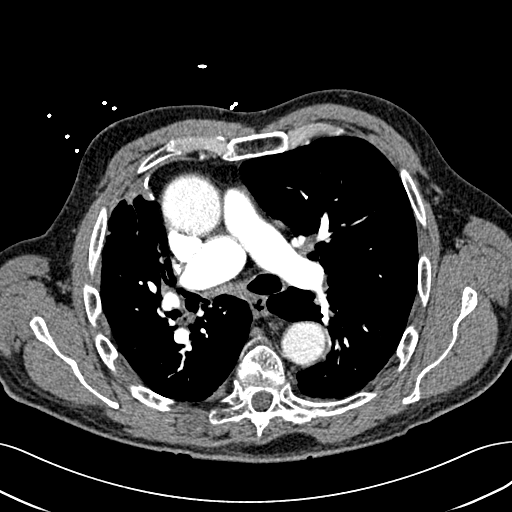
[im 164/269  lung]
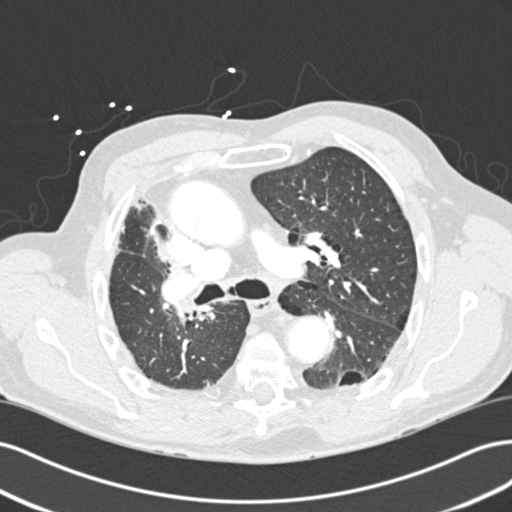
[im 179/269  mediastinal]
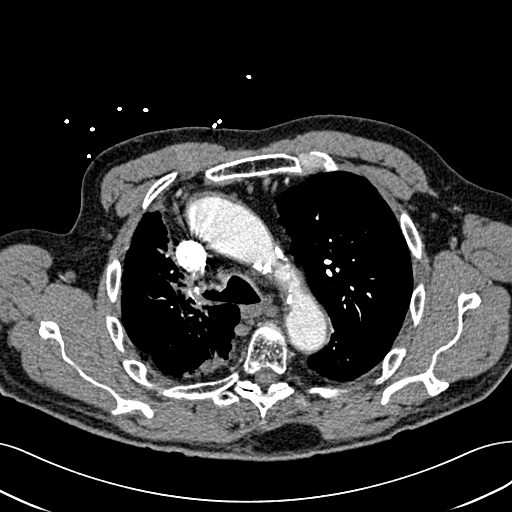
[im 194/269  lung]
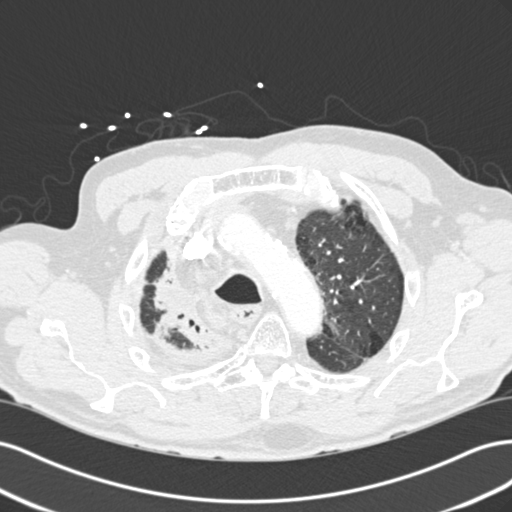
[im 209/269  mediastinal]
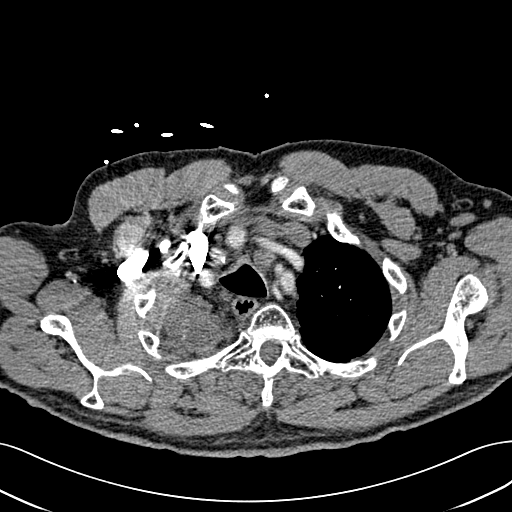
[im 224/269  lung]
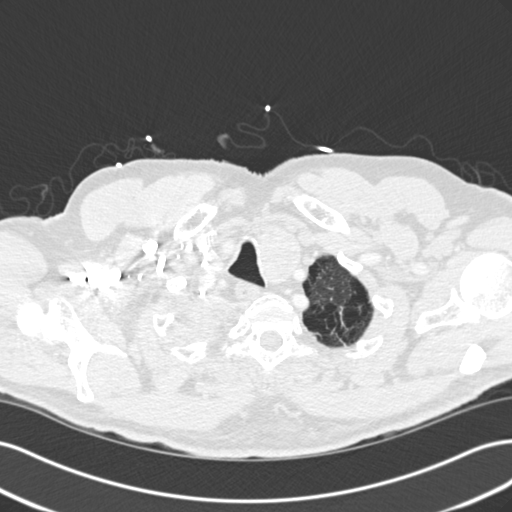
[im 239/269  mediastinal]
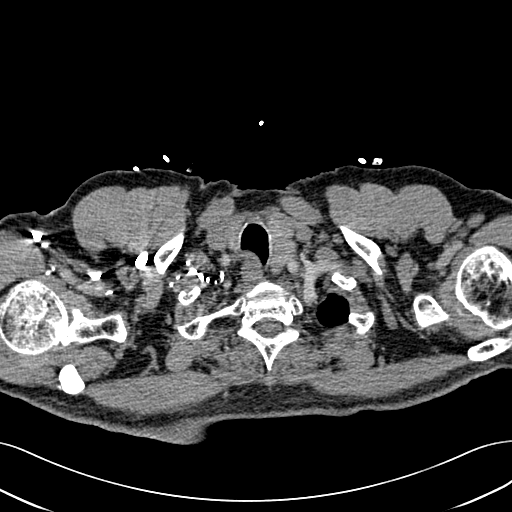
[im 254/269  lung]
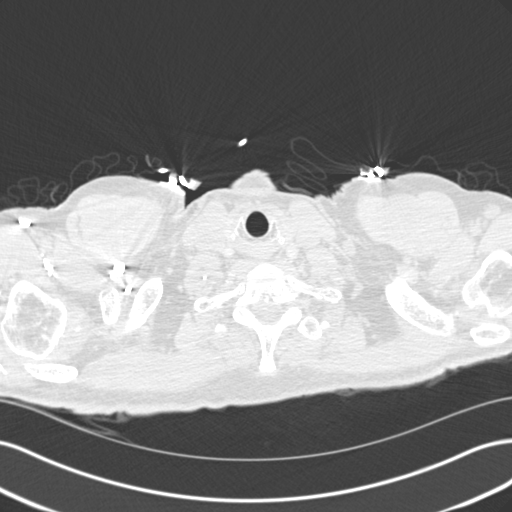

[Series 7: cor pe 2.0 mpr · coronal · 0.54mm/px · 1 of 142 slices shown]
[im 71/142  mediastinal]
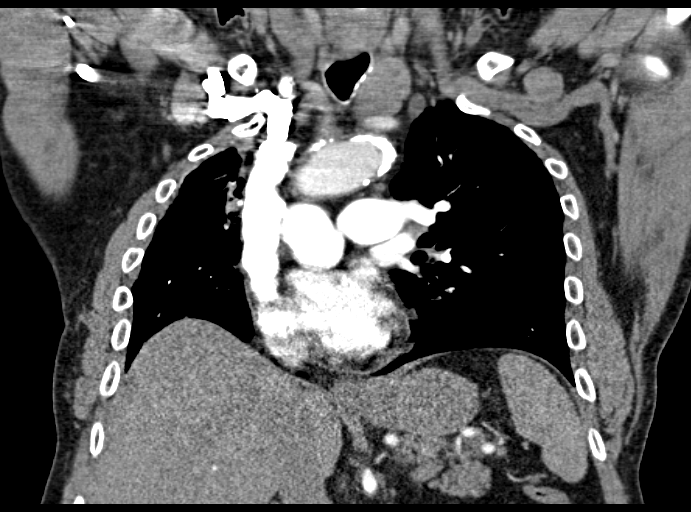

[18 of 36 positions shown; findings below may reference images not displayed]

FINDINGS: There are no filling defects in the pulmonary arterial tree to
suggest acute pulmonary thromboembolism.

Left-sided thyroid mass.

There is no obvious aortic dissection. Atherosclerotic
calcifications in the ascending aorta are noted. Maximal diameter of
the ascending aorta is 4.1 cm.

There is volume loss in the right lung. There is confluent opacity
at the right apex which is heterogeneous, with areas of soft tissue
and fluid density extending from the right hilum towards the right
apex. The right upper lobe bronchus ends blindly into this soft
tissue density. It is difficult to determine if a right upper
lobectomy has been performed. Several right peritracheal mildly
enlarged lymph nodes are present. 10 mm node on image 36. Several
scattered smaller pretracheal and AP window nodes are present.

Three vessel coronary artery calcifications.

No pericardial effusion.  No pleural effusion.  No pneumothorax.

Mild patchy densities at the right lung base are nonspecific and may
be related to volume loss. There are several blebs in the apex of
the left upper lobe as well as the superior segment of the left
lower lobe. No left-sided lung mass is identified.

Left-sided airways are patent. The trachea somewhat irregular. The
right mainstem bronchus has an irregular appearance.

There is kyphosis of the thoracic spine without acute bony
deformity.

Nonspecific 11 mm hypodensity in the left lobe of the liver.

Review of the MIP images confirms the above findings.
IMPRESSION: There is abnormal heterogeneous density at the right apex extending
from the right hilum associated with volume loss. Findings may be
related to prior granulomatous disease, however lung malignancy is
not excluded. There is is associated mediastinal adenopathy.

Left-sided thyroid mass.  Ultrasound is recommended.

Liver hypodensity is nonspecific. Metastatic disease is not
excluded. MRI is recommended.

## 2015-07-25 IMAGING — CR DG CHEST 1V PORT
1 series · 1 of 1 positions shown · non-contrast
Comparison: None.

CLINICAL DATA: 72-year-old male with acute chest pain. Patient
reports personal history of lung cancer treated with partial right
lung resection and radiation at [facility] within the last
several years.

EXAM:
PORTABLE CHEST - 1 VIEW

[ap]
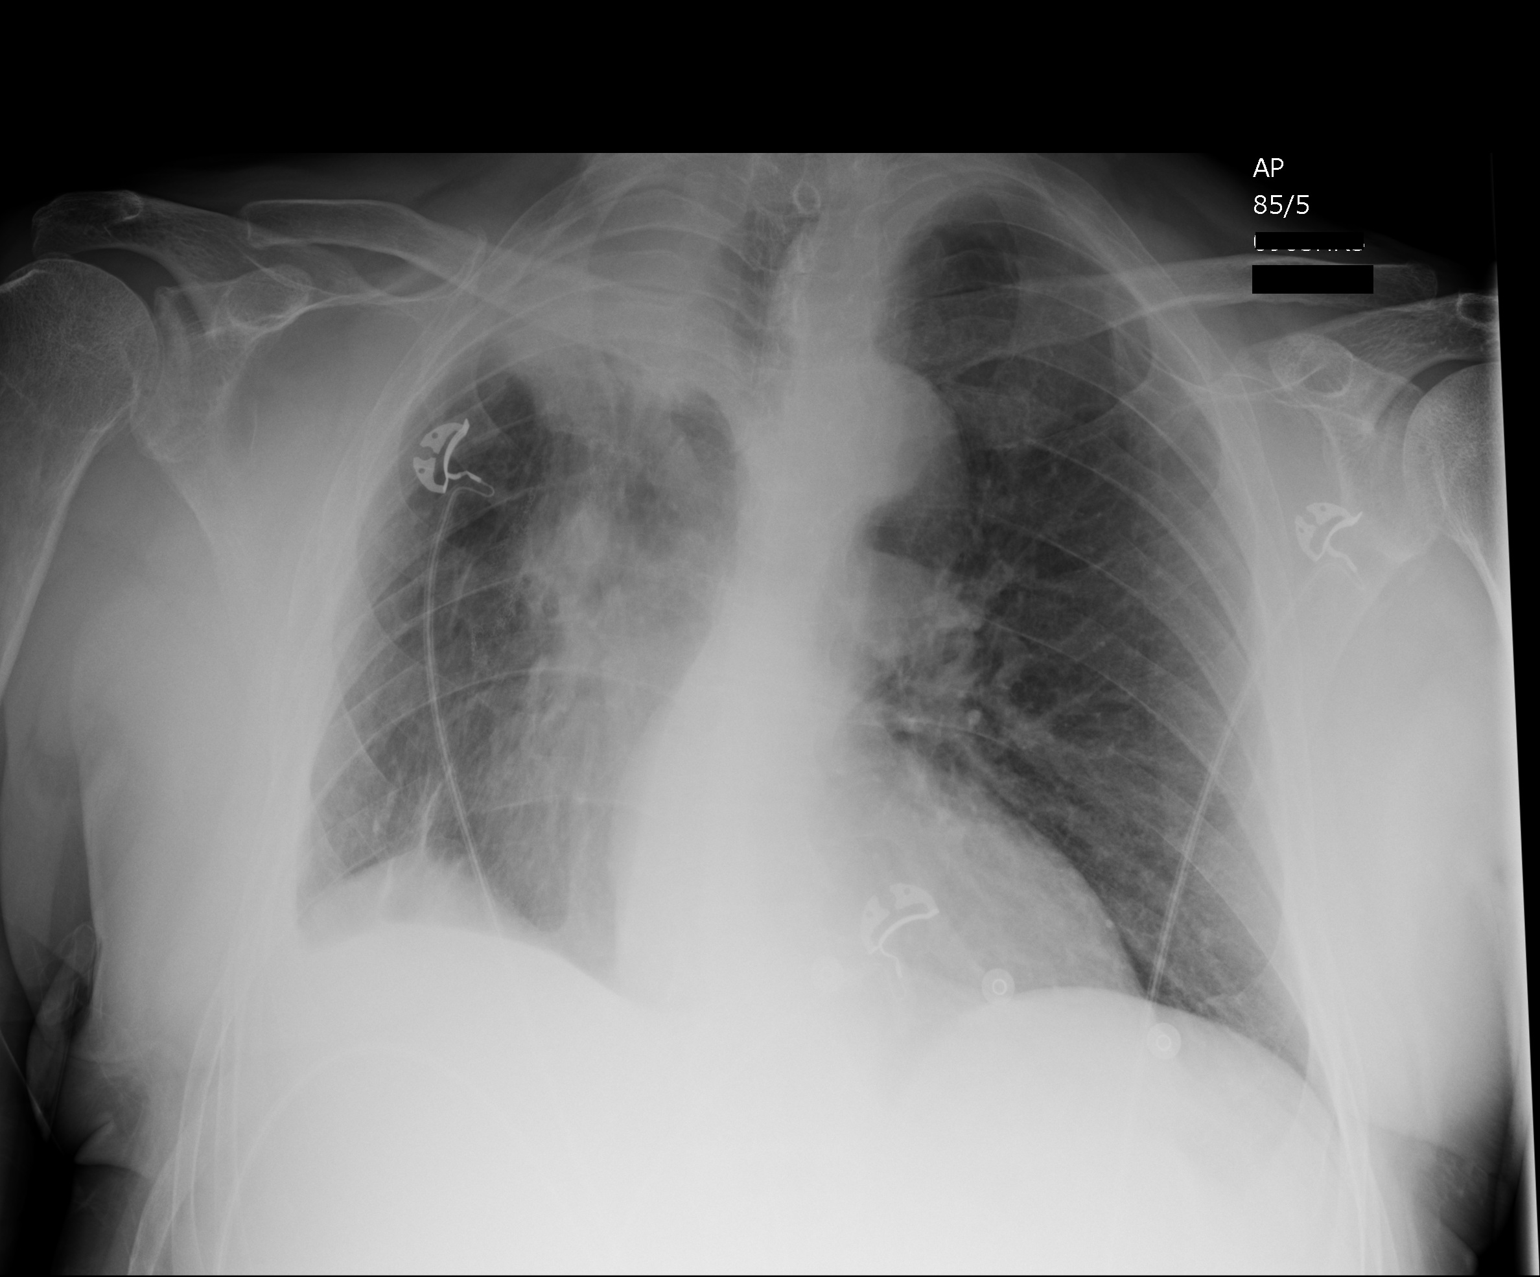

[1 of 1 positions shown; findings below may reference images not displayed]

FINDINGS: Portable AP upright view at 5451 hrs. Volume loss and architectural
distortion in the right hemi thorax with confluent upper lung
opacity measuring 6-7 cm diameter. This is inseparable from the
right peritracheal contour. There is some rightward shift of
mediastinal contents. Staple line at the level of the right hilum.
Curvilinear opacity at the right lung base most resembles scarring.

Tortuous descending thoracic aorta. The left lung is clear allowing
for portable technique. No acute osseous abnormality identified. No
pneumothorax. No pulmonary edema. No definite effusion.
IMPRESSION: Distorted and partially opacified right lung status post right
partial pneumonectomy and radiation with no prior studies for
comparison. Nonspecific 6-7 cm opacity in the apex of the right hemi
thorax.

If further imaging evaluation is necessary at this time recommend
chest CT with IV contrast.
